# Patient Record
Sex: Male | Born: 1990 | Marital: Single | Smoking: Never smoker
Health system: Southern US, Community
[De-identification: ages and names within clinical notes are randomized; demographics above are authoritative.]

## PROBLEM LIST (undated history)

## (undated) DIAGNOSIS — I10 Essential (primary) hypertension: Secondary | ICD-10-CM

## (undated) DIAGNOSIS — R569 Unspecified convulsions: Secondary | ICD-10-CM

## (undated) DIAGNOSIS — E669 Obesity, unspecified: Secondary | ICD-10-CM

## (undated) HISTORY — DX: Unspecified convulsions: R56.9

## (undated) HISTORY — DX: Obesity, unspecified: E66.9

---

## 2012-12-29 ENCOUNTER — Encounter: Payer: Self-pay | Admitting: Neurology

## 2012-12-29 ENCOUNTER — Ambulatory Visit (INDEPENDENT_AMBULATORY_CARE_PROVIDER_SITE_OTHER): Payer: PRIVATE HEALTH INSURANCE | Admitting: Neurology

## 2012-12-29 VITALS — BP 150/82 | HR 50 | Ht 69.25 in | Wt 355.0 lb

## 2012-12-29 DIAGNOSIS — G40309 Generalized idiopathic epilepsy and epileptic syndromes, not intractable, without status epilepticus: Secondary | ICD-10-CM | POA: Insufficient documentation

## 2012-12-29 MED ORDER — PHENYTOIN SODIUM EXTENDED 100 MG PO CAPS: 100.0000 mg | ORAL_CAPSULE | Freq: Three times a day (TID) | ORAL | Status: DC

## 2012-12-29 NOTE — Patient Instructions (Signed)
Seizure, Adult  A seizure is abnormal electrical activity in the brain. Seizures can cause a change in attention or behavior (altered mental status). Seizures often involve uncontrollable shaking (convulsions). Seizures usually last from 30 seconds to 2 minutes. Epilepsy is a brain disorder in which a patient has repeated seizures over time.  CAUSES   There are many different problems that can cause seizures. In some cases, no cause is found. Common causes of seizures include:   Head injuries.   Brain tumors.   Infections.   Imbalance of chemicals in the blood.   Kidney failure or liver failure.   Heart disease.   Drug abuse.   Stroke.   Withdrawal from certain drugs or alcohol.   Birth defects.   Malfunction of a neurosurgical device placed in the brain.  SYMPTOMS   Symptoms vary depending on the part of the brain that is involved. Right before a seizure, you may have a warning (aura) that a seizure is about to occur. An aura may include the following symptoms:    Fear or anxiety.   Nausea.   Feeling like the room is spinning (vertigo).   Vision changes, such as seeing flashing lights or spots.  Common symptoms during a seizure include:   Convulsions.   Drooling.   Rapid eye movements.   Grunting.   Loss of bladder and bowel control.   Bitter taste in the mouth.  After a seizure, you may feel confused and sleepy. You may also have an injury resulting from convulsions during the seizure.  DIAGNOSIS   Your caregiver will perform a physical exam and run some tests to determine the type and cause of your seizure. These tests may include:   Blood tests.   A lumbar puncture test. In this test, a small amount of fluid is removed from the spine and examined.   Electrocardiography (ECG). This test records the electrical activity in your heart.   Imaging tests, such as computed tomography (CT) scans or magnetic resonance imaging (MRI).   Electroencephalography (EEG). This test records the electrical  activity in your brain.  TREATMENT   Seizures usually stop on their own. Treatment will depend on the cause of your seizure. In some cases, medicine may be given to prevent future seizures.  HOME CARE INSTRUCTIONS    If you are given medicines, take them exactly as prescribed by your caregiver.   Keep all follow-up appointments as directed by your caregiver.   Do not swim or drive until your caregiver says it is okay.   Teach friends and family what to do if you have a seizure. They should:   Lay you on the ground to prevent a fall.   Put a cushion under your head.   Loosen any tight clothing around your neck.   Turn you on your side. If vomiting occurs, this helps keep your airway clear.   Stay with you until you recover.  SEEK IMMEDIATE MEDICAL CARE IF:   The seizure lasts longer than 2 to 5 minutes.   The seizure is severe or the person does not wake up after the seizure.   The person has altered mental status.  Drive the person to the emergency department or call your local emergency services (911 in [place]  MAKE SURE YOU:   Understand these instructions.   Will watch your condition.   Will get help right away if you are not doing well or get worse.  Document Released: 09/06/2000 Document Revised: 12/02/2011 Document   Reviewed: 08/28/2011  [employer] Patient Information 2013 [employer].

## 2012-12-29 NOTE — Progress Notes (Signed)
Reason for visit: Seizures  Matthew Andrade is a 22 y.o. male  History of present illness:  Matthew Andrade is a 22 year old right-handed black male with a history of 2 seizure events prior to this office visit. The first seizure occurred on 09/23/2012. The patient was noted to have some sweats, and decreased responsiveness, and then he had a generalized seizure event with jerking and stiffening. The patient bit his tongue. The patient had a second event while at work on 12/23/2012. The patient once again felt hot and sweaty, he had a feeling of anxiety, and a sensation that his left arm was numb. The patient then blacked out, and he bit his tongue, and had some vomiting afterwards. The patient woke up in the hospital. A CT scan of the brain was done at [facility], and this reportedly was normal. The results are not available to me. The patient has not been placed on anticonvulsant medications, but he has not been operating a motor vehicle. The patient indicates no family history of seizures. The patient denies any prior history of head trauma or concussion. The patient is sent to this office for an evaluation. The patient indicates that he smokes marijuana on a daily basis.  Past Medical History  Diagnosis Date  . Seizures   . Obesity     History reviewed. No pertinent past surgical history.  Family History  Problem Relation Age of Onset  . Hypertension Mother   . Hypertension Father   . Diabetes Father   . Heart Problems Maternal Grandmother   . Diabetes Maternal Grandfather   . Heart Problems Maternal Grandfather     Social history:  reports that he has never smoked. He does not have any smokeless tobacco history on file. He reports that  drinks alcohol. He reports that he uses illicit drugs (Marijuana).  Medications:  No current outpatient prescriptions on file prior to visit.   No current facility-administered medications on file prior to visit.    Allergies:  Allergies   Allergen Reactions  . Sulfa Antibiotics     Hives    ROS:  Out of a complete 14 system review of symptoms, the patient complains only of the following symptoms, and all other reviewed systems are negative.  Palpitations Feeling hot, increased thirst Headache Seizures Anxiety Racing thoughts  Blood pressure 150/82, pulse 50, height 5' 9.25" (1.759 m), weight 355 lb (161.027 kg).  Physical Exam  General: The patient is alert and cooperative at the time of the examination. The patient is markedly obese.  Head: Pupils are equal, round, and reactive to light. Discs are flat bilaterally.  Neck: The neck is supple, no carotid bruits are noted.  Respiratory: The respiratory examination is clear.  Cardiovascular: The cardiovascular examination reveals a regular rate and rhythm, no obvious murmurs or rubs are noted.  Skin: Extremities are without significant edema.  Neurologic Exam  Mental status:  Cranial nerves: Facial symmetry is present. There is good sensation of the face to pinprick and soft touch bilaterally. The strength of the facial muscles and the muscles to head turning and shoulder shrug are normal bilaterally. Speech is well enunciated, no aphasia or dysarthria is noted. Extraocular movements are full. Visual fields are full.  Motor: The motor testing reveals 5 over 5 strength of all 4 extremities. Good symmetric motor tone is noted throughout.  Sensory: Sensory testing is intact to pinprick, soft touch, vibration sensation, and position sense on all 4 extremities. No evidence of extinction is noted.  Coordination: Cerebellar testing reveals good finger-nose-finger and heel-to-shin bilaterally.  Gait and station: Gait is normal. Tandem gait is normal. Romberg is negative. No drift is seen  Reflexes: Deep tendon reflexes are symmetric and normal bilaterally. Toes are downgoing bilaterally.   Assessment/Plan:  1. Generalized seizure disorder, new onset  The  patient will be placed on Dilantin therapy at this time. The patient will be set up for MRI evaluation of the brain with and without gadolinium, and he will have an EEG study. The patient will followup in about 4 months. Three weeks from now, the patient will come in to get blood work drawn to check the Dilantin level. The patient will contact me if he has another seizure event. The patient is not to drive for least 6 months following the last seizure. I have discussed the possibility of reducing his marijuana use, as this may lower the seizure threshold.  Marlan Palau MD 12/29/2012 7:56 PM  [facility] [address] [place], [place] [address]  Phone [phone] Fax [phone]

## 2012-12-30 ENCOUNTER — Telehealth: Payer: Self-pay | Admitting: Neurology

## 2012-12-30 ENCOUNTER — Ambulatory Visit (INDEPENDENT_AMBULATORY_CARE_PROVIDER_SITE_OTHER): Payer: PRIVATE HEALTH INSURANCE

## 2012-12-30 DIAGNOSIS — G40309 Generalized idiopathic epilepsy and epileptic syndromes, not intractable, without status epilepticus: Secondary | ICD-10-CM

## 2012-12-30 NOTE — Telephone Encounter (Signed)
I called the patient. I talked with the mother. The EEG study was unremarkable. I'll contact him once I have the results of the MRI study that was ordered. The patient will go on Dilantin.

## 2012-12-30 NOTE — Procedures (Signed)
  History:   Matthew Andrade is a 22 year old gentleman with a history of at least 2 events witnessed with generalized jerking and stiffening and tongue biting. The patient is being evaluated for probable seizure events.  This is a routine EEG. No skull defects are noted. The patient was on no medications prior to the EEG study. The patient will be going on Dilantin.   EEG classification: Normal awake and drowsy  Description of the recording: The background rhythms of this recording consists of a fairly well modulated medium amplitude alpha rhythm of 11 Hz that is reactive to eye opening and closure. As the record progresses, the patient appears to remain in the waking state throughout the recording. Photic stimulation was performed, resulting in a bilateral and symmetric photic driving response. Hyperventilation was also performed, resulting in a minimal buildup of the background rhythm activities without significant slowing seen. Toward the end of the recording, the patient enters the drowsy state with slight symmetric slowing seen. The patient never enters stage II sleep. At no time during the recording does there appear to be evidence of spike or spike wave discharges or evidence of focal slowing. EKG monitor shows no evidence of cardiac rhythm abnormalities with a heart rate of 56.  Impression: This is a normal EEG recording in the waking and drowsy state. No evidence of ictal or interictal discharges are seen.

## 2012-12-31 ENCOUNTER — Telehealth: Payer: Self-pay | Admitting: Neurology

## 2013-01-06 ENCOUNTER — Ambulatory Visit
Admission: RE | Admit: 2013-01-06 | Discharge: 2013-01-06 | Disposition: A | Discharge status: Home / Self Care | Payer: PRIVATE HEALTH INSURANCE | Source: Ambulatory Visit | Attending: Neurology | Admitting: Neurology

## 2013-01-06 DIAGNOSIS — G40309 Generalized idiopathic epilepsy and epileptic syndromes, not intractable, without status epilepticus: Secondary | ICD-10-CM

## 2013-01-06 MED ORDER — GADOBENATE DIMEGLUMINE 529 MG/ML IV SOLN
20.0000 mL | Freq: Once | INTRAVENOUS | Status: AC | PRN
  Administered 2013-01-06: 20 mL via INTRAVENOUS

## 2013-01-07 ENCOUNTER — Telehealth: Payer: Self-pay | Admitting: Neurology

## 2013-01-07 NOTE — Telephone Encounter (Signed)
I called the patient. The MRI of the brain is essentially normal. He is to stay on the dilantin.

## 2013-01-11 ENCOUNTER — Telehealth: Payer: Self-pay | Admitting: *Deleted

## 2013-01-11 NOTE — Telephone Encounter (Signed)
Please read below...

## 2013-01-11 NOTE — Telephone Encounter (Signed)
I agree with a medical assessment and medical device given.

## 2013-01-11 NOTE — Telephone Encounter (Signed)
I called and LMVM for mother < Matthew Andrade about the MRI results.   Normal (small spots scar tissue noted, no acute findings).  No other testing needed. To call back as needed.

## 2013-01-11 NOTE — Telephone Encounter (Signed)
Patient mother is calling wanting to know if her son can drive to and from work because his scans where good. She also wanted to know if her son seizures where drug or alcohol induced. Her son left out that he started smoking e-cigs and both times he had siezures he had smoked a e-cig  But since then he has not smoked one. I let her know per Dr. Anne Hahn notes he is not released to drive until 6 months from the time he had his last  Seizure. And She should follow his recommendations until he says other wise she showed understanding. Return # [phone]

## 2013-01-13 ENCOUNTER — Telehealth: Payer: Self-pay | Admitting: *Deleted

## 2013-01-13 DIAGNOSIS — Z5181 Encounter for therapeutic drug level monitoring: Secondary | ICD-10-CM

## 2013-01-13 NOTE — Telephone Encounter (Signed)
Patient mother called stating she would like to know if there's any other testing to find out why Serjio is having seizures.

## 2013-01-13 NOTE — Telephone Encounter (Signed)
I called the mother. I discussed the MRI results of the brain, and an EEG study. The patient is on Dilantin, and he is feeling somewhat tired on the medication. We will check blood work at this time. The patient will come in for this. The patient is not to drive for 6 months.

## 2013-01-14 ENCOUNTER — Other Ambulatory Visit: Payer: Self-pay | Admitting: *Deleted

## 2013-01-14 ENCOUNTER — Other Ambulatory Visit: Payer: Self-pay | Admitting: Neurology

## 2013-01-14 DIAGNOSIS — Z5181 Encounter for therapeutic drug level monitoring: Secondary | ICD-10-CM

## 2013-01-15 ENCOUNTER — Telehealth: Payer: Self-pay | Admitting: Neurology

## 2013-01-15 LAB — COMPREHENSIVE METABOLIC PANEL
Albumin: 4.1 g/dL (ref 3.5–5.5)
BUN: 9 mg/dL (ref 6–20)
CO2: 27 mmol/L (ref 19–28)
Calcium: 9.3 mg/dL (ref 8.7–10.2)
Chloride: 100 mmol/L (ref 96–108)
GFR calc Af Amer: 145 mL/min/{1.73_m2} (ref >59)
Glucose: 67 mg/dL (ref 65–99)
Total Bilirubin: 0.1 mg/dL (ref 0.0–1.2)
Total Protein: 6.8 g/dL (ref 6.0–8.5)

## 2013-01-15 LAB — CBC WITH DIFFERENTIAL
Basophils Absolute: 0 10*3/uL (ref 0.0–0.2)
Eos: 9 % — ABNORMAL HIGH (ref 0–5)
MCH: 30.5 pg (ref 26.6–33.0)
Monocytes Absolute: 0.8 10*3/uL (ref 0.1–0.9)
Neutrophils Relative %: 55 % (ref 40–74)
Platelets: 182 10*3/uL (ref 155–379)
RBC: 4.82 x10E6/uL (ref 4.14–5.80)
WBC: 9.1 10*3/uL (ref 3.4–10.8)

## 2013-01-15 LAB — PHENYTOIN LEVEL, TOTAL: Phenytoin Lvl: 2.4 ug/mL — ABNORMAL LOW (ref 10.0–20.0)

## 2013-01-15 NOTE — Telephone Encounter (Signed)
I called patient. The blood work is relatively unremarkable, eosinophil count is somewhat elevated, but the Dilantin level is low. The patient is having some problems with tolerance of the Dilantin. We will keep at 300 mg daily for now, go to 400 mg daily in the future.

## 2013-02-19 NOTE — Telephone Encounter (Signed)
i

## 2013-04-29 ENCOUNTER — Encounter: Payer: Self-pay | Admitting: Nurse Practitioner

## 2013-04-29 ENCOUNTER — Ambulatory Visit (INDEPENDENT_AMBULATORY_CARE_PROVIDER_SITE_OTHER): Payer: PRIVATE HEALTH INSURANCE | Admitting: Nurse Practitioner

## 2013-04-29 VITALS — BP 147/89 | HR 50 | Ht 70.5 in | Wt 353.0 lb

## 2013-04-29 DIAGNOSIS — G40309 Generalized idiopathic epilepsy and epileptic syndromes, not intractable, without status epilepticus: Secondary | ICD-10-CM

## 2013-04-29 DIAGNOSIS — Z79899 Other long term (current) drug therapy: Secondary | ICD-10-CM

## 2013-04-29 LAB — PHENYTOIN LEVEL, TOTAL: Phenytoin Lvl: 2 ug/mL — ABNORMAL LOW (ref 10.0–20.0)

## 2013-04-29 NOTE — Progress Notes (Signed)
I have read the note, and I agree with the clinical assessment and plan.  

## 2013-04-29 NOTE — Patient Instructions (Addendum)
Continue Dilantin at current dose Will check Dilantin level today Given patient educational material on epilepsy No driving for 6 months from last seizure (12/23/12) Followup in 6 months

## 2013-04-29 NOTE — Progress Notes (Signed)
  Reason for visit  followup for seizure disorder Matthew Andrade 22 y.o.male  HPI: Matthew Andrade, 22 year old black male returns for followup with history of 2 seizure events. He was initially evaluated in this office by Dr. Anne Hahn 12/29/2012.  First seizure occurred in 09/23/2012 he was noted to have sweats and decreased responsiveness and then a generalized jerking and stiffening of his body. He bit his tongue. The second event occurred while at work 12-2012. The patient once again with hot and sweaty. He had a feeling of anxiety and a sensation that his left arm was numb he  then blacked out,  bit his tongue and had some vomiting after words, he woke up in the hospital. CT scan of the brain was reportedly normal at [facility] these results are not available.  He was not placed on anticonvulsive medication. There is no family history of seizures. The patient denies any prior history of head trauma. The patient does indicate that he smokes marijuana on a daily basis. MRI of the brain 01/06/2013 was normal without acute findings. EEG was normal. The patient was placed on Dilantin 300 mg by Dr. Anne Hahn. Dilantin level drawn for 01/14/13  was 2.4. Will repeat level today. He denies any side effects to the medication.    Medications Current Outpatient Prescriptions on File Prior to Visit  Medication Sig Dispense Refill  . phenytoin (DILANTIN) 100 MG ER capsule Take 1 capsule (100 mg total) by mouth 3 (three) times daily.  90 capsule  5   No current facility-administered medications on file prior to visit.   ROS:  negative  Physical Exam General: well developed, morbidly obese  seated, in no evident distress Head: head normocephalic and atraumatic. Oropharynx benign  Neurologic Exam Mental Status: Awake and fully alert. Oriented to place and time. Follows all commands. Speech and language normal.   Cranial Nerves: Fundoscopic exam reveals flat  discs. Pupils equal, briskly reactive to light.  Extraocular movements full without nystagmus. Visual fields full to confrontation. Hearing intact and symmetric to finger snap. Facial sensation intact. Face, tongue, palate move normally and symmetrically. Neck flexion and extension normal.  Motor: Normal bulk and tone. Normal strength in all tested extremity muscles.No focal weakness Sensory.: intact to touch and pinprick and vibratory.  Coordination: Rapid alternating movements normal in all extremities. Finger-to-nose and heel-to-shin performed accurately bilaterally. Gait and Station: Arises from chair without difficulty. Stance is normal. Gait demonstrates normal stride length and balance . Able to heel, toe and tandem walk without difficulty.  Reflexes: 2+ and symmetric. Toes downgoing.     ASSESSMENT: New onset generalized seizure disorder, currently on Dilantin 300 daily without further seizure activity EEG was normal MRI of the brain was normal     PLAN: Will check Dilantin level today Continue 300 mg daily for now No driving for 6 months from most recent seizure which occurred 12-23-2012 Given patient information materials epilepsy and answered additional questions Followup in 6 months  Nilda Riggs, GNP-BC APRN

## 2013-04-30 ENCOUNTER — Other Ambulatory Visit: Payer: Self-pay | Admitting: Nurse Practitioner

## 2013-04-30 MED ORDER — PHENYTOIN SODIUM EXTENDED 100 MG PO CAPS: ORAL_CAPSULE | ORAL | Status: DC

## 2013-11-01 ENCOUNTER — Encounter: Payer: Self-pay | Admitting: Nurse Practitioner

## 2013-11-01 ENCOUNTER — Encounter (INDEPENDENT_AMBULATORY_CARE_PROVIDER_SITE_OTHER): Payer: Self-pay

## 2013-11-01 ENCOUNTER — Ambulatory Visit (INDEPENDENT_AMBULATORY_CARE_PROVIDER_SITE_OTHER): Payer: PRIVATE HEALTH INSURANCE | Admitting: Nurse Practitioner

## 2013-11-01 VITALS — BP 154/70 | HR 70 | Ht 70.5 in | Wt 349.0 lb

## 2013-11-01 DIAGNOSIS — G40309 Generalized idiopathic epilepsy and epileptic syndromes, not intractable, without status epilepticus: Secondary | ICD-10-CM

## 2013-11-01 DIAGNOSIS — Z79899 Other long term (current) drug therapy: Secondary | ICD-10-CM

## 2013-11-01 NOTE — Progress Notes (Signed)
[facility]  PATIENT: Matthew Andrade DOB: Feb 18, 1991   REASON FOR VISIT: Followup for seizure disorder   HISTORY OF PRESENT ILLNESS: Mr. Ordway, 23 year old male returns for followup. He has a history of 2 seizure events and is currently on Dilantin 400 mg daily. His last level was checked in August 2014. He has not had seizure activity since April 2014. He is accompanied today by his mother. He needs repeat labs. He returns for reevaluation.   HISTORY: of 2 seizure events. He was initially evaluated in this office by Dr. Anne Hahn 12/29/2012. First seizure occurred in 09/23/2012 he was noted to have sweats and decreased responsiveness and then a generalized jerking and stiffening of his body. He bit his tongue. The second event occurred while at work 12-2012. The patient once again with hot and sweaty. He had a feeling of anxiety and a sensation that his left arm was numb he then blacked out, bit his tongue and had some vomiting after words, he woke up in the hospital. CT scan of the brain was reportedly normal at [facility] these results are not available. He was not placed on anticonvulsive medication. There is no family history of seizures. The patient denies any prior history of head trauma. The patient does indicate that he smokes marijuana on a daily basis. MRI of the brain 01/06/2013 was normal without acute findings. EEG was normal. The patient was placed on Dilantin 300 mg by Dr. Anne Hahn. Dilantin level drawn for 01/14/13 was 2.4. Will repeat level today. He denies any side effects to the medication.      REVIEW OF SYSTEMS: Full 14 system review of systems performed and notable only for those listed, all others are neg:  Constitutional: N/A  Cardiovascular: N/A  Ear/Nose/Throat: N/A  Skin: N/A  Eyes: N/A  Respiratory: N/A  Gastroitestinal: N/A  Hematology/Lymphatic: N/A  Endocrine: N/A Musculoskeletal:N/A  Allergy/Immunology: N/A  Neurological:  tremor Psychiatric: N/A   ALLERGIES: Allergies  Allergen Reactions  . Sulfa Antibiotics     Hives    HOME MEDICATIONS: Outpatient Prescriptions Prior to Visit  Medication Sig Dispense Refill  . phenytoin (DILANTIN) 100 MG ER capsule 2 caps in the am, 2 caps in the pm  120 capsule  6   No facility-administered medications prior to visit.    PAST MEDICAL HISTORY: Past Medical History  Diagnosis Date  . Seizures   . Obesity     PAST SURGICAL HISTORY: History reviewed. No pertinent past surgical history.  FAMILY HISTORY: Family History  Problem Relation Age of Onset  . Hypertension Mother   . Hypertension Father   . Diabetes Father   . Heart Problems Maternal Grandmother   . Diabetes Maternal Grandfather   . Heart Problems Maternal Grandfather     SOCIAL HISTORY: History   Social History  . Marital Status: Single    Spouse Name: N/A    Number of Children: 0  . Years of Education: college-1   Occupational History  .    [employer]   Social History Main Topics  . Smoking status: Never Smoker   . Smokeless tobacco: Never Used  . Alcohol Use: Yes     Comment: Once monthly  . Drug Use: Yes    Special: Marijuana     Comment: daily  . Sexual Activity: Not on file   Other Topics Concern  . Not on file   Social History Narrative   Patient lives at home with parents.  Patient is single.   Patient is right handed.    Patient has a high school education.    Patient is currently working part time at [employer].      PHYSICAL EXAM  Filed Vitals:   11/01/13 1021  BP: 154/70  Pulse: 70  Height: 5' 10.5" (1.791 m)  Weight: 349 lb (158.305 kg)   Body mass index is 49.35 kg/(m^2).  Generalized: Well developed, morbidly obese male in no acute distress   Neurological examination   Mentation: Alert oriented to time, place, history taking. Follows all commands speech and language fluent  Cranial nerve II-XII: Pupils were equal round reactive to  light extraocular movements were full, visual field were full on confrontational test. Facial sensation and strength were normal. hearing was intact to finger rubbing bilaterally. Uvula tongue midline. head turning and shoulder shrug were normal and symmetric.Tongue protrusion into cheek strength was normal. Motor: normal bulk and tone, full strength in the BUE, BLE,  No focal weakness, no tremor Coordination: finger-nose-finger, heel-to-shin bilaterally, no dysmetria Reflexes: Brachioradialis 2/2, biceps 2/2, triceps 2/2, patellar 2/2, Achilles 2/2, plantar responses were flexor bilaterally. Gait and Station: Rising up from seated position without assistance, normal stance,  moderate stride, good arm swing, smooth turning, able to perform tiptoe, and heel walking without difficulty. Tandem gait is steady  DIAGNOSTIC DATA (LABS, IMAGING, TESTING) - I reviewed patient records, labs, notes, testing and imaging myself where available.  Lab Results  Component Value Date   WBC 9.1 01/14/2013   HGB 14.7 01/14/2013   HCT 42.5 01/14/2013   MCV 88 01/14/2013   PLT 182 01/14/2013      Component Value Date/Time   NA 140 01/14/2013 1049   K 4.1 01/14/2013 1049   CL 100 01/14/2013 1049   CO2 27 01/14/2013 1049   GLUCOSE 67 01/14/2013 1049   BUN 9 01/14/2013 1049   CREATININE 0.82 01/14/2013 1049   CALCIUM 9.3 01/14/2013 1049   PROT 6.8 01/14/2013 1049   AST 27 01/14/2013 1049   ALT 38 01/14/2013 1049   ALKPHOS 56 01/14/2013 1049   BILITOT 0.1 01/14/2013 1049   GFRNONAA 126 01/14/2013 1049   GFRAA 145 01/14/2013 1049       ASSESSMENT AND PLAN  [age] y.o. year old male  has a past medical history of Seizures and Obesity. here  to followup for seizure disorder. Last  seizure activity April 2014.  Continue Dilantin at current dose Will check trough level later this week If  labs are stable can followup yearly and when necessary Nilda RiggsNancy Carolyn Martin, [facility] HospitalGNP, [facility] LLCBC, APRN  [facility] [address] [place], [place] [address] [phone]

## 2013-11-01 NOTE — Progress Notes (Signed)
I have read the note, and I agree with the clinical assessment and plan.  WILLIS,CHARLES KEITH   

## 2013-11-01 NOTE — Patient Instructions (Signed)
Continue Dilantin at current dose Will check trough level later this week If  labs are stable can followup yearly and when necessary

## 2013-11-04 ENCOUNTER — Other Ambulatory Visit (INDEPENDENT_AMBULATORY_CARE_PROVIDER_SITE_OTHER): Payer: Self-pay

## 2013-11-04 DIAGNOSIS — Z0289 Encounter for other administrative examinations: Secondary | ICD-10-CM

## 2013-11-04 DIAGNOSIS — G40309 Generalized idiopathic epilepsy and epileptic syndromes, not intractable, without status epilepticus: Secondary | ICD-10-CM

## 2013-11-04 DIAGNOSIS — Z79899 Other long term (current) drug therapy: Secondary | ICD-10-CM

## 2013-11-04 LAB — CBC WITH DIFFERENTIAL/PLATELET
BASOS ABS: 0 10*3/uL (ref 0.0–0.2)
Basos: 0 %
EOS ABS: 0.5 10*3/uL — AB (ref 0.0–0.4)
Eos: 5 %
HEMATOCRIT: 43.7 % (ref 37.5–51.0)
Hemoglobin: 15.3 g/dL (ref 12.6–17.7)
LYMPHS ABS: 2.4 10*3/uL (ref 0.7–3.1)
Lymphs: 26 %
MCH: 31.4 pg (ref 26.6–33.0)
MCHC: 35 g/dL (ref 31.5–35.7)
MCV: 90 fL (ref 79–97)
MONOS ABS: 1 10*3/uL — AB (ref 0.1–0.9)
Monocytes: 10 %
Neutrophils Absolute: 5.4 10*3/uL (ref 1.4–7.0)
Neutrophils Relative %: 59 %
RBC: 4.88 x10E6/uL (ref 4.14–5.80)
RDW: 13.6 % (ref 12.3–15.4)
WBC: 9.3 10*3/uL (ref 3.4–10.8)

## 2013-11-04 LAB — COMPREHENSIVE METABOLIC PANEL
ALT: 44 IU/L (ref 0–44)
AST: 23 IU/L (ref 0–40)
Albumin/Globulin Ratio: 1.8 (ref 1.1–2.5)
Albumin: 4.2 g/dL (ref 3.5–5.5)
Alkaline Phosphatase: 66 IU/L (ref 39–117)
BUN/Creatinine Ratio: 10 (ref 8–19)
BUN: 8 mg/dL (ref 6–20)
CALCIUM: 9.4 mg/dL (ref 8.7–10.2)
CHLORIDE: 101 mmol/L (ref 96–108)
CO2: 29 mmol/L (ref 18–29)
Creatinine, Ser: 0.84 mg/dL (ref 0.76–1.27)
GFR calc non Af Amer: 123 mL/min/{1.73_m2} (ref >59)
GFR, EST AFRICAN AMERICAN: 143 mL/min/{1.73_m2} (ref >59)
GLUCOSE: 66 mg/dL (ref 65–99)
Globulin, Total: 2.4 g/dL (ref 1.5–4.5)
POTASSIUM: 4.2 mmol/L (ref 3.5–5.2)
Sodium: 141 mmol/L (ref 134–144)
TOTAL PROTEIN: 6.6 g/dL (ref 6.0–8.5)

## 2013-11-04 LAB — PHENYTOIN LEVEL, TOTAL: Phenytoin Lvl: 3.7 ug/mL — ABNORMAL LOW (ref 10.0–20.0)

## 2013-11-05 ENCOUNTER — Telehealth: Payer: Self-pay | Admitting: Nurse Practitioner

## 2013-11-05 MED ORDER — PHENYTOIN SODIUM EXTENDED 100 MG PO CAPS: ORAL_CAPSULE | ORAL | Status: DC

## 2013-11-05 NOTE — Telephone Encounter (Signed)
TC to patient, Dilantin Level remains low but better than last specimen obtained.Increase to 500mg  total dose daily. Will call in new RX Patient verbalizes understanding.No seizure activity since April of last year. Will call for seizure activity.

## 2014-08-16 ENCOUNTER — Telehealth: Payer: Self-pay | Admitting: Neurology

## 2014-08-16 NOTE — Telephone Encounter (Signed)
Left message for patient regarding rescheduling 11/01/14 appointment time per Carolyn's schedule, moved it to earlier appointment in the day.

## 2014-08-23 ENCOUNTER — Other Ambulatory Visit: Payer: Self-pay

## 2014-08-23 MED ORDER — PHENYTOIN SODIUM EXTENDED 100 MG PO CAPS: ORAL_CAPSULE | ORAL | Status: DC

## 2014-11-01 ENCOUNTER — Ambulatory Visit: Payer: PRIVATE HEALTH INSURANCE | Admitting: Nurse Practitioner

## 2014-11-01 ENCOUNTER — Ambulatory Visit (INDEPENDENT_AMBULATORY_CARE_PROVIDER_SITE_OTHER): Payer: PRIVATE HEALTH INSURANCE | Admitting: Nurse Practitioner

## 2014-11-01 ENCOUNTER — Encounter: Payer: Self-pay | Admitting: Nurse Practitioner

## 2014-11-01 VITALS — BP 132/75 | HR 62 | Temp 97.9°F | Ht 70.5 in | Wt 397.0 lb

## 2014-11-01 DIAGNOSIS — G40309 Generalized idiopathic epilepsy and epileptic syndromes, not intractable, without status epilepticus: Secondary | ICD-10-CM

## 2014-11-01 DIAGNOSIS — Z5181 Encounter for therapeutic drug level monitoring: Secondary | ICD-10-CM

## 2014-11-01 NOTE — Progress Notes (Signed)
[facility]  PATIENT: Matthew Andrade DOB: 03/05/1991   REASON FOR VISIT: Follow-up for seizure disorder HISTORY FROM: Patient    HISTORY OF PRESENT ILLNESS:Mr. Lusty, 24 year old male returns for followup. He has a history of 2 seizure events and is currently on Dilantin 500 mg daily. He denies any side effects to the drug, he has not had any balance issues, no falls. His last level was checked in February 2015. He had a dose adjustment at that time.  He has not had seizure activity since April 2014.  He needs repeat labs. He has gained about 40 pounds since last seen. He denies any daytime drowsiness or snoring at night He returns for reevaluation.   HISTORY: of 2 seizure events. He was initially evaluated in this office by Dr. Anne Hahn 12/29/2012. First seizure occurred in 09/23/2012 he was noted to have sweats and decreased responsiveness and then a generalized jerking and stiffening of his body. He bit his tongue. The second event occurred while at work 12-2012. The patient once again with hot and sweaty. He had a feeling of anxiety and a sensation that his left arm was numb he then blacked out, bit his tongue and had some vomiting after words, he woke up in the hospital. CT scan of the brain was reportedly normal at [facility] these results are not available. He was not placed on anticonvulsive medication. There is no family history of seizures. The patient denies any prior history of head trauma. The patient does indicate that he smokes marijuana on a daily basis. MRI of the brain 01/06/2013 was normal without acute findings. EEG was normal. The patient was placed on Dilantin 300 mg by Dr. Anne Hahn. Dilantin level drawn for 01/14/13 was 2.4. Will repeat level today. He denies any side effects to the medication.   REVIEW OF SYSTEMS: Full 14 system review of systems performed and notable only for those listed, all others are neg:  Constitutional: neg  Cardiovascular:  neg Ear/Nose/Throat: neg  Skin: neg Eyes: neg Respiratory: neg Gastroitestinal: neg  Hematology/Lymphatic: neg  Endocrine: neg Musculoskeletal:neg Allergy/Immunology: neg Neurological: neg Psychiatric: neg Sleep : neg   ALLERGIES: Allergies  Allergen Reactions  . Sulfa Antibiotics     Hives    HOME MEDICATIONS: Outpatient Prescriptions Prior to Visit  Medication Sig Dispense Refill  . phenytoin (DILANTIN) 100 MG ER capsule 2 caps in the am, 3 caps in the pm 150 capsule 2   No facility-administered medications prior to visit.    PAST MEDICAL HISTORY: Past Medical History  Diagnosis Date  . Seizures   . Obesity     PAST SURGICAL HISTORY: History reviewed. No pertinent past surgical history.  FAMILY HISTORY: Family History  Problem Relation Age of Onset  . Hypertension Mother   . Hypertension Father   . Diabetes Father   . Heart Problems Maternal Grandmother   . Diabetes Maternal Grandfather   . Heart Problems Maternal Grandfather     SOCIAL HISTORY: History   Social History  . Marital Status: Single    Spouse Name: N/A    Number of Children: 0  . Years of Education: college-1   Occupational History  .    [employer]  [employer]   Social History Main Topics  . Smoking status: Never Smoker   . Smokeless tobacco: Never Used  . Alcohol Use: Yes     Comment: Once monthly  . Drug Use: Yes    Special: Marijuana     Comment:  daily  . Sexual Activity: Not on file   Other Topics Concern  . Not on file   Social History Narrative   Patient lives at home with parents.    Patient is single.   Patient is right handed.    Patient has a high school education.    Patient is currently working part time at [employer].      PHYSICAL EXAM  Filed Vitals:   11/01/14 0829  BP: 132/75  Pulse: 62  Temp: 97.9 F (36.6 C)  TempSrc: Oral  Height: 5' 10.5" (1.791 m)  Weight: 397 lb (180.078 kg)   Body mass index is 56.14 kg/(m^2).  Generalized: Well  developed, morbidly obese male in no acute distress  Head: normocephalic and atraumatic,. Oropharynx benign  Neck: Supple, no carotid bruits  Cardiac: Regular rate rhythm, no murmur  Musculoskeletal: No deformity   Neurological examination   Mentation: Alert oriented to time, place, history taking. Attention span and concentration appropriate. Recent and remote memory intact.  Follows all commands speech and language fluent.   Cranial nerve II-XII: Fundoscopic exam reveals sharp disc margins.Pupils were equal round reactive to light extraocular movements were full, visual field were full on confrontational test. Facial sensation and strength were normal. hearing was intact to finger rubbing bilaterally. Uvula tongue midline. head turning and shoulder shrug were normal and symmetric.Tongue protrusion into cheek strength was normal. Motor: normal bulk and tone, full strength in the BUE, BLE, fine finger movements normal, no pronator drift. No focal weakness Sensory: normal and symmetric to light touch, pinprick, and  Vibration, proprioception  Coordination: finger-nose-finger, heel-to-shin bilaterally, no dysmetria Reflexes: Brachioradialis 2/2, biceps 2/2, triceps 2/2, patellar 2/2, Achilles 2/2, plantar responses were flexor bilaterally. Gait and Station: Rising up from seated position without assistance, normal stance,  moderate stride, good arm swing, smooth turning, able to perform tiptoe, and heel walking without difficulty. Tandem gait is steady  DIAGNOSTIC DATA (LABS, IMAGING, TESTING) - I reviewed patient records, labs, notes, testing and imaging myself where available.  Lab Results  Component Value Date   WBC 9.3 11/04/2013   HGB 15.3 11/04/2013   HCT 43.7 11/04/2013   MCV 90 11/04/2013   PLT 182 01/14/2013      Component Value Date/Time   NA 141 11/04/2013 0911   K 4.2 11/04/2013 0911   CL 101 11/04/2013 0911   CO2 29 11/04/2013 0911   GLUCOSE 66 11/04/2013 0911   BUN 8  11/04/2013 0911   CREATININE 0.84 11/04/2013 0911   CALCIUM 9.4 11/04/2013 0911   PROT 6.6 11/04/2013 0911   AST 23 11/04/2013 0911   ALT 44 11/04/2013 0911   ALKPHOS 66 11/04/2013 0911   BILITOT <0.2 11/04/2013 0911   GFRNONAA 123 11/04/2013 0911   GFRAA 143 11/04/2013 0911   ASSESSMENT AND PLAN  24 y.o. year old male  has a past medical history of Seizures and Obesity. here to follow-up. Last seizure activity occurred in April 2014. He is currently on Dilantin 500 mg total dose daily. He has already taken his medication today.  Continue Dilantin at current dose, will refill once labs back Will check trough level later this week already taken morning dose If labs are stable follow up yearly Nilda RiggsNancy Carolyn Tyger Wichman, [facility] Hospital[facility]NP, [facility], APRN  [facility] [address], [address] [place], [place] [address] [phone]

## 2014-11-01 NOTE — Patient Instructions (Signed)
Continue Dilantin at current dose, will refill once labs back Will check trough level later this week already taken morning dose If labs are stable follow up yearly

## 2014-11-01 NOTE — Progress Notes (Signed)
I have read the note, and I agree with the clinical assessment and plan.  Matthew Andrade,Matthew Andrade   

## 2014-11-09 ENCOUNTER — Telehealth: Payer: Self-pay | Admitting: Nurse Practitioner

## 2014-11-09 NOTE — Telephone Encounter (Signed)
Please let patient know I am still waiting for him to get labs to monitor side effects of his medications

## 2014-11-11 NOTE — Telephone Encounter (Signed)
Spoke to mother. She verbalized understanding. She says she will have patient to come in next week.

## 2015-04-20 ENCOUNTER — Other Ambulatory Visit: Payer: Self-pay

## 2015-04-20 MED ORDER — PHENYTOIN SODIUM EXTENDED 100 MG PO CAPS: ORAL_CAPSULE | ORAL | Status: DC

## 2015-11-02 ENCOUNTER — Ambulatory Visit (INDEPENDENT_AMBULATORY_CARE_PROVIDER_SITE_OTHER): Payer: PRIVATE HEALTH INSURANCE | Admitting: Nurse Practitioner

## 2015-11-02 ENCOUNTER — Encounter: Payer: Self-pay | Admitting: Nurse Practitioner

## 2015-11-02 VITALS — BP 148/94 | HR 68 | Ht 70.5 in | Wt >= 6400 oz

## 2015-11-02 DIAGNOSIS — Z5181 Encounter for therapeutic drug level monitoring: Secondary | ICD-10-CM | POA: Diagnosis not present

## 2015-11-02 DIAGNOSIS — G40309 Generalized idiopathic epilepsy and epileptic syndromes, not intractable, without status epilepticus: Secondary | ICD-10-CM

## 2015-11-02 MED ORDER — PHENYTOIN SODIUM EXTENDED 100 MG PO CAPS: ORAL_CAPSULE | ORAL | Status: AC

## 2015-11-02 NOTE — Progress Notes (Signed)
[facility]  PATIENT: Matthew Andrade DOB: 1991-04-15   REASON FOR VISIT: follow-up generalized epilepsy HISTORY FROM:patient    HISTORY OF PRESENT ILLNESS:Matthew Andrade, 25 year old male returns for followup. He has a history of 2 seizure events and is currently on Dilantin 500 mg daily. He denies any side effects to the drug, he has not had any balance issues, no falls. His last level was checked in February 2015. He had a dose adjustment at that time. He has not had seizure activity since April 2014. He did not get labs after his last visit .He needs repeat labs. He has gained about 40 pounds since last seen. He denies any daytime drowsiness or snoring at night He returns for reevaluation. He only recently started going to the gym.   HISTORY: of 2 seizure events. He was initially evaluated in this office by Dr. Anne Hahn 12/29/2012. First seizure occurred in 09/23/2012 he was noted to have sweats and decreased responsiveness and then a generalized jerking and stiffening of his body. He bit his tongue. The second event occurred while at work 12-2012. The patient once again with hot and sweaty. He had a feeling of anxiety and a sensation that his left arm was numb he then blacked out, bit his tongue and had some vomiting after words, he woke up in the hospital. CT scan of the brain was reportedly normal at [facility] these results are not available. He was not placed on anticonvulsive medication. There is no family history of seizures. The patient denies any prior history of head trauma. The patient does indicate that he smokes marijuana on a daily basis. MRI of the brain 01/06/2013 was normal without acute findings. EEG was normal. The patient was placed on Dilantin 300 mg by Dr. Anne Hahn. Dilantin level drawn for 01/14/13 was 2.4. Will repeat level today. He denies any side effects to the medication.    REVIEW OF SYSTEMS: Full 14 system review of systems performed and notable only  for those listed, all others are neg:  Constitutional: neg  Cardiovascular: neg Ear/Nose/Throat: neg  Skin: neg Eyes: neg Respiratory: neg Gastroitestinal: neg  Hematology/Lymphatic: neg  Endocrine: neg Musculoskeletal:neg Allergy/Immunology: neg Neurological: neg Psychiatric: neg Sleep : neg   ALLERGIES: Allergies  Allergen Reactions  . Sulfa Antibiotics     Hives    HOME MEDICATIONS: Outpatient Prescriptions Prior to Visit  Medication Sig Dispense Refill  . phenytoin (DILANTIN) 100 MG ER capsule 2 caps in the am, 3 caps in the pm 150 capsule 6   No facility-administered medications prior to visit.    PAST MEDICAL HISTORY: Past Medical History  Diagnosis Date  . Seizures (HCC)   . Obesity     PAST SURGICAL HISTORY: History reviewed. No pertinent past surgical history.  FAMILY HISTORY: Family History  Problem Relation Age of Onset  . Hypertension Mother   . Hypertension Father   . Diabetes Father   . Heart Problems Maternal Grandmother   . Diabetes Maternal Grandfather   . Heart Problems Maternal Grandfather     SOCIAL HISTORY: Social History   Social History  . Marital Status: Single    Spouse Name: N/A  . Number of Children: 0  . Years of Education: college-1   Occupational History  .    [employer]  [employer]   Social History Main Topics  . Smoking status: Never Smoker   . Smokeless tobacco: Never Used  . Alcohol Use: Yes     Comment: Once monthly  .  Drug Use: Yes    Special: Marijuana     Comment: daily  . Sexual Activity: Not on file   Other Topics Concern  . Not on file   Social History Narrative   Patient lives at home with parents.    Patient is single.   Patient is right handed.    Patient has a high school education.    Patient is currently working part time at [employer].      PHYSICAL EXAM  Filed Vitals:   11/02/15 0850  BP: 148/94  Pulse: 68  Height: 5' 10.5" (1.791 m)  Weight: 442 lb 9.6 oz (200.762 kg)   Body  mass index is 62.59 kg/(m^2). Generalized: Well developed, morbidly obese male in no acute distress  Head: normocephalic and atraumatic,. Oropharynx benign  Neck: Supple, no carotid bruits  Cardiac: Regular rate rhythm, no murmur  Musculoskeletal: No deformity   Neurological examination   Mentation: Alert oriented to time, place, history taking. Attention span and concentration appropriate. Recent and remote memory intact. Follows all commands speech and language fluent.   Cranial nerve II-XII: Pupils were equal round reactive to light extraocular movements were full, visual field were full on confrontational test. Facial sensation and strength were normal. hearing was intact to finger rubbing bilaterally. Uvula tongue midline. head turning and shoulder shrug were normal and symmetric.Tongue protrusion into cheek strength was normal. Motor: normal bulk and tone, full strength in the BUE, BLE, fine finger movements normal, no pronator drift. No focal weakness Sensory: normal and symmetric to light touch, pinprick, and Vibration, proprioception  Coordination: finger-nose-finger, heel-to-shin bilaterally, no dysmetria Reflexes: Brachioradialis 2/2, biceps 2/2, triceps 2/2, patellar 2/2, Achilles 2/2, plantar responses were flexor bilaterally. Gait and Station: Rising up from seated position without assistance, normal stance, moderate stride, good arm swing, smooth turning, able to perform tiptoe, and heel walking without difficulty. Tandem gait is steady  DIAGNOSTIC DATA (LABS, IMAGING, TESTING)   ASSESSMENT AND PLAN  25 y.o. year old male  has a past medical history of Seizures (HCC) and Obesity. here to follow up. Last seizure April 2014.   PLAN:Will check labs today CBC CMP and dilantin level Continue Dilantin at current dose will refill Healthy diet and weight loss and exercise for overall health and well-being, patient has gained 40 pounds since last seen Follow-up yearly and  when necessary Nilda Riggs, [facility], [facility], APRN  [facility] [address], [address] [place], [place] [address] [phone]

## 2015-11-02 NOTE — Progress Notes (Signed)
I have read the note, and I agree with the clinical assessment and plan.  Khya Halls KEITH   

## 2015-11-02 NOTE — Patient Instructions (Signed)
Will check labs today  Continue Dilantin at current dose Healthy diet and weight loss and exercise for overall well-being Follow-up yearly and when necessary

## 2015-11-03 ENCOUNTER — Telehealth: Payer: Self-pay | Admitting: Nurse Practitioner

## 2015-11-03 DIAGNOSIS — G40309 Generalized idiopathic epilepsy and epileptic syndromes, not intractable, without status epilepticus: Secondary | ICD-10-CM

## 2015-11-03 DIAGNOSIS — Z5181 Encounter for therapeutic drug level monitoring: Secondary | ICD-10-CM

## 2015-11-03 LAB — COMPREHENSIVE METABOLIC PANEL
ALT: 34 IU/L (ref 0–44)
AST: 23 IU/L (ref 0–40)
Albumin/Globulin Ratio: 1.7 (ref 1.1–2.5)
Albumin: 4.4 g/dL (ref 3.5–5.5)
Alkaline Phosphatase: 54 IU/L (ref 39–117)
BUN/Creatinine Ratio: 10 (ref 8–19)
BUN: 8 mg/dL (ref 6–20)
Bilirubin Total: 0.3 mg/dL (ref 0.0–1.2)
CO2: 27 mmol/L (ref 18–29)
CREATININE: 0.81 mg/dL (ref 0.76–1.27)
Calcium: 9.5 mg/dL (ref 8.7–10.2)
Chloride: 99 mmol/L (ref 96–106)
GFR calc Af Amer: 143 mL/min/{1.73_m2} (ref >59)
GFR calc non Af Amer: 124 mL/min/{1.73_m2} (ref >59)
GLOBULIN, TOTAL: 2.6 g/dL (ref 1.5–4.5)
GLUCOSE: 95 mg/dL (ref 65–99)
Potassium: 4.3 mmol/L (ref 3.5–5.2)
SODIUM: 141 mmol/L (ref 134–144)
Total Protein: 7 g/dL (ref 6.0–8.5)

## 2015-11-03 LAB — CBC WITH DIFFERENTIAL/PLATELET
Basophils Absolute: 0 10*3/uL (ref 0.0–0.2)
Basos: 0 %
EOS (ABSOLUTE): 0.4 10*3/uL (ref 0.0–0.4)
Eos: 5 %
Hematocrit: 43.3 % (ref 37.5–51.0)
Hemoglobin: 14.3 g/dL (ref 12.6–17.7)
Immature Grans (Abs): 0 10*3/uL (ref 0.0–0.1)
Immature Granulocytes: 0 %
Lymphocytes Absolute: 2.2 10*3/uL (ref 0.7–3.1)
Lymphs: 26 %
MCH: 29.8 pg (ref 26.6–33.0)
MCHC: 33 g/dL (ref 31.5–35.7)
MCV: 90 fL (ref 79–97)
MONOCYTES: 10 %
MONOS ABS: 0.9 10*3/uL (ref 0.1–0.9)
Neutrophils Absolute: 5 10*3/uL (ref 1.4–7.0)
Neutrophils: 59 %
Platelets: 222 10*3/uL (ref 150–379)
RBC: 4.8 x10E6/uL (ref 4.14–5.80)
RDW: 13.9 % (ref 12.3–15.4)
WBC: 8.5 10*3/uL (ref 3.4–10.8)

## 2015-11-03 LAB — PHENYTOIN LEVEL, TOTAL: PHENYTOIN (DILANTIN), SERUM: 3.3 ug/mL — AB (ref 10.0–20.0)

## 2015-11-03 NOTE — Telephone Encounter (Signed)
Telephone call to patient. Made aware  Dilantin level 3.3. On questioning he is not taking his medication as prescribed. He is taking 2 capsules in the morning and 2 at night. He was asked to increase to 2 capsules in the morning and 3 at night and get repeat level in 10 days and is agreeable. The prescription that was called in  yesterday was for  2 in the morning and 3 at night

## 2016-05-08 ENCOUNTER — Encounter: Payer: 59 | Attending: Family Medicine | Admitting: Nutrition

## 2016-05-08 DIAGNOSIS — Z6841 Body Mass Index (BMI) 40.0 and over, adult: Secondary | ICD-10-CM | POA: Insufficient documentation

## 2016-05-08 DIAGNOSIS — Z713 Dietary counseling and surveillance: Secondary | ICD-10-CM | POA: Insufficient documentation

## 2016-05-08 DIAGNOSIS — I10 Essential (primary) hypertension: Secondary | ICD-10-CM

## 2016-05-08 NOTE — Patient Instructions (Signed)
Goals 1` Follow Plate Method 2. Exercise 60 minutes 4 times per week. 3. Cut down on fast foods to more than 1-2 per week. 4. Plan meals better and take more meals to work 5. Increase more fresh fruits or vegetables. 6. Lose 1-2 lbs per week.

## 2016-05-08 NOTE — Progress Notes (Signed)
  Medical Nutrition Therapy:  Appt start time: 1530 end time:  1630.   Assessment:  Primary concerns today:  Morbid Obesity. Lives with his parents. Works FT with [employer]. 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 Gained 170 lbs in the last 2 years. Played football in high school and now not physically active. Sedentary job.  Eats 2-3 meals per day. Eats a lot of fast foods. Recently blood started BP meds after having ear infections. Physical activity: likes to play golf and basketball, swims. Had first seizure 4 years ago. On Dilantin.  Only had 2 seizures 4 years ago.   Has cut out a lot of gatorade, sodas, sweet tea and working on eating low fat, lower sodium foods in the recent past.   Diet is excessive in calories, protein, fat and sodium. Willing to make lifestyle changes to lose weight and improve BP and reduce risk of developing DM and other health related problems.  Preferred Learning Style:   Auditory  Visual  Hands on  Learning Readiness:   Ready  Change in progress   MEDICATIONS: see list   DIETARY INTAKE:  24-hr recall:  B ( AM): Sausage, egg and cheese biscuit, Water Snk ( AM): none L ( PM): Fast food lunch, water; taco bell, subway, wendys, water Snk ( PM): none D ( PM): Chicken patties, mac/cheese, mashed potatoes, green beans, water Snk ( PM): none Beverages: water  Usual physical activity: 1 hr of golf or basketball or swimming  Estimated energy needs: 1800 calories 200 g carbohydrates 135 g protein 50 g fat  Progress Towards Goal(s):  In progress.   Nutritional Diagnosis:  [place]-3.3 Overweight/obesity As related to Excessive calorie intake.  As evidenced by BMI >50.    Intervention: Low Sodium, Low Fat High Fiber Diet, My Plate, portion sizes, meal planning, CHO counting, emotional eating, timing of meals,  risk for DM and other chronic health problems, benefits of exercise and need for weight loss.  Goals 1`  Follow Plate Method 2. Exercise 60 minutes 4 times per week. 3. Cut down on fast foods to more than 1-2 per week. 4. Plan meals better and take more meals to work 5. Increase more fresh fruits or vegetables. 6. Lose 1-2 lbs per week.  Teaching Method Utilized:  Visual Auditory Hands on  Handouts given during visit include:  The Plate Method   Meal Plan Card        Low Sodium Low Fat High Fiber Diet information  Barriers to learning/adherence to lifestyle change: none  Demonstrated degree of understanding via:  Teach Back   Monitoring/Evaluation:  Dietary intake, exercise, meal plannign, and body weight in 1 month(s).

## 2016-05-17 ENCOUNTER — Encounter: Payer: Self-pay | Admitting: Nutrition

## 2016-06-12 ENCOUNTER — Ambulatory Visit: Payer: 59 | Admitting: Nutrition

## 2016-06-19 ENCOUNTER — Ambulatory Visit: Payer: 59 | Admitting: Nutrition

## 2016-10-16 ENCOUNTER — Encounter: Payer: Self-pay | Admitting: Nurse Practitioner

## 2016-11-01 ENCOUNTER — Ambulatory Visit: Payer: PRIVATE HEALTH INSURANCE | Admitting: Nurse Practitioner

## 2017-04-08 ENCOUNTER — Emergency Department (HOSPITAL_COMMUNITY)
Admission: EM | Admit: 2017-04-08 | Discharge: 2017-04-08 | Disposition: A | Discharge status: Home / Self Care | Payer: 59 | Attending: Emergency Medicine | Admitting: Emergency Medicine

## 2017-04-08 ENCOUNTER — Emergency Department (HOSPITAL_COMMUNITY): Payer: 59

## 2017-04-08 ENCOUNTER — Encounter (HOSPITAL_COMMUNITY): Payer: Self-pay

## 2017-04-08 DIAGNOSIS — Z79899 Other long term (current) drug therapy: Secondary | ICD-10-CM | POA: Diagnosis not present

## 2017-04-08 DIAGNOSIS — I10 Essential (primary) hypertension: Secondary | ICD-10-CM | POA: Diagnosis not present

## 2017-04-08 DIAGNOSIS — J189 Pneumonia, unspecified organism: Secondary | ICD-10-CM | POA: Diagnosis not present

## 2017-04-08 DIAGNOSIS — R079 Chest pain, unspecified: Secondary | ICD-10-CM | POA: Diagnosis present

## 2017-04-08 HISTORY — DX: Essential (primary) hypertension: I10

## 2017-04-08 LAB — CBC WITH DIFFERENTIAL/PLATELET
Basophils Absolute: 0 10*3/uL (ref 0.0–0.1)
Basophils Relative: 0 %
Eosinophils Absolute: 0 10*3/uL (ref 0.0–0.7)
Eosinophils Relative: 0 %
HEMATOCRIT: 40.9 % (ref 39.0–52.0)
HEMOGLOBIN: 13.3 g/dL (ref 13.0–17.0)
LYMPHS ABS: 1.4 10*3/uL (ref 0.7–4.0)
Lymphocytes Relative: 14 %
MCH: 29.8 pg (ref 26.0–34.0)
MCHC: 32.5 g/dL (ref 30.0–36.0)
MCV: 91.7 fL (ref 78.0–100.0)
MONOS PCT: 12 %
Monocytes Absolute: 1.2 10*3/uL — ABNORMAL HIGH (ref 0.1–1.0)
NEUTROS ABS: 7.6 10*3/uL (ref 1.7–7.7)
NEUTROS PCT: 74 %
Platelets: 155 10*3/uL (ref 150–400)
RBC: 4.46 MIL/uL (ref 4.22–5.81)
RDW: 14.2 % (ref 11.5–15.5)
WBC: 10.3 10*3/uL (ref 4.0–10.5)

## 2017-04-08 LAB — BASIC METABOLIC PANEL
Anion gap: 9 (ref 5–15)
BUN: 5 mg/dL — AB (ref 6–20)
CHLORIDE: 99 mmol/L — AB (ref 101–111)
CO2: 27 mmol/L (ref 22–32)
CREATININE: 0.94 mg/dL (ref 0.61–1.24)
Calcium: 8.6 mg/dL — ABNORMAL LOW (ref 8.9–10.3)
GFR calc non Af Amer: >60 mL/min (ref >60)
Glucose, Bld: 105 mg/dL — ABNORMAL HIGH (ref 65–99)
POTASSIUM: 3.6 mmol/L (ref 3.5–5.1)
Sodium: 135 mmol/L (ref 135–145)

## 2017-04-08 LAB — D-DIMER, QUANTITATIVE: D-Dimer, Quant: 1.87 ug/mL-FEU — ABNORMAL HIGH (ref 0.00–0.50)

## 2017-04-08 MED ORDER — IOPAMIDOL (ISOVUE-370) INJECTION 76%
120.0000 mL | Freq: Once | INTRAVENOUS | Status: AC | PRN
  Administered 2017-04-08: 120 mL via INTRAVENOUS

## 2017-04-08 MED ORDER — AZITHROMYCIN 250 MG PO TABS: 250.0000 mg | ORAL_TABLET | Freq: Every day | ORAL | 0 refills | Status: AC

## 2017-04-08 NOTE — Discharge Instructions (Signed)
The testing indicates that you have a pneumonia.  There is no indication that you have a blood clot in the lungs.  Make sure that you are getting plenty of rest, and drinking a lot of fluids.  For pain and fever, use either acetaminophen or ibuprofen.   You  can use a cough medicine like Robitussin-DM to help with cough.  Return here, if needed for problems.

## 2017-04-08 NOTE — ED Triage Notes (Signed)
Reports of diarrhea that started Sunday. Now having fever, pain in neck and upper back. States it feels like its hard to take a deep breath.

## 2017-04-08 NOTE — ED Provider Notes (Signed)
[facility]-EMER[facility]Y DEPT Provider Note   CSN: [id number] Arrival date & time: 04/08/17  1247     History   Chief Complaint Chief Complaint  Patient presents with  . Fever  . Fatigue    HPI Matthew Andrade is a 26 y.o. male.  He is here for evaluation of pleuritic chest pain, present for several days, with general achiness and low-grade fever.  He denies cough, shaking chills, nausea, vomiting, weakness or dizziness.  He is having shortness of breath with exertion.  He has pain in the lateral neck, bilaterally, with movement of the head and neck.  He denies paresthesias.  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.  There are no other known modifying factors.  HPI  Past Medical History:  Diagnosis Date  . Hypertension   . Obesity   . Seizures [facility](HCC)     Patient Active Problem List   Diagnosis Date Noted  . 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 No pertinent surgical history.     Home Medications    Prior to Admission medications   Medication Sig Start Date End Date Taking? Authorizing Provider  ibuprofen (ADVIL,MOTRIN) 200 MG tablet Take 400 mg by mouth every 6 (six) hours as needed for fever.   Yes [provider]  phenytoin (DILANTIN) 100 MG ER capsule 2 caps in the am, 3 caps in the pm Patient taking differently: Take 200-300 mg by mouth 2 (two) times daily. 2 caps in the am, 3 caps in the pm 11/02/15  Yes Nilda RiggsMartin, Nancy Carolyn, NP  azithromycin (ZITHROMAX) 250 MG tablet Take 1 tablet (250 mg total) by mouth daily. Take first 2 tablets together, then 1 every day until finished. 04/08/17   Mancel BaleWentz, Rumi Kolodziej, MD    Family History Family History  Problem Relation Age of Onset  . Hypertension Mother   . Hypertension Father   . Diabetes Father   . Heart Problems Maternal Grandmother   . Diabetes Maternal Grandfather   . Heart Problems Maternal Grandfather     Social History Social History  Substance Use Topics  . Smoking status:  Never Smoker  . Smokeless tobacco: Never Used  . Alcohol use Yes     Comment: Once monthly     Allergies   Sulfa antibiotics   Review of Systems Review of Systems  All other systems reviewed and are negative.    Physical Exam Updated Vital Signs BP (!) 164/92   Pulse 90   Temp 99.7 F (37.6 C) (Oral)   Resp 20   Ht 5\' 11"  (1.803 m)   Wt (!) 204.1 kg (450 lb)   SpO2 94%   BMI 62.76 kg/m   Physical Exam  Constitutional: He is oriented to person, place, and time. He appears well-developed.  Morbidly obese  HENT:  Head: Normocephalic and atraumatic.  Right Ear: External ear normal.  Left Ear: External ear normal.  Eyes: Pupils are equal, round, and reactive to light. Conjunctivae and EOM are normal.  Neck: Normal range of motion and phonation normal. Neck supple.  Cardiovascular: Normal rate, regular rhythm and normal heart sounds.   Pulmonary/Chest: Effort normal and breath sounds normal. No respiratory distress. He exhibits no tenderness and no bony tenderness.  Abdominal: Soft. There is no tenderness.  Musculoskeletal: Normal range of motion.  Neurological: He is alert and oriented to person, place, and time. No cranial nerve deficit or sensory deficit. He exhibits normal muscle tone. Coordination normal.  Skin: Skin is warm,  dry and intact.  Psychiatric: He has a normal mood and affect. His behavior is normal. Judgment and thought content normal.  Nursing note and vitals reviewed.    ED Treatments / Results  Labs (all labs ordered are listed, but only abnormal results are displayed) Labs Reviewed  CBC WITH DIFFERENTIAL/PLATELET - Abnormal; Notable for the following:       Result Value   Monocytes Absolute 1.2 (*)    All other components within normal limits  BASIC METABOLIC PANEL - Abnormal; Notable for the following:    Chloride 99 (*)    Glucose, Bld 105 (*)    BUN 5 (*)    Calcium 8.6 (*)    All other components within normal limits  D-DIMER,  QUANTITATIVE (NOT AT [facility]) - Abnormal; Notable for the following:    D-Dimer, Quant 1.87 (*)    All other components within normal limits    EKG  EKG Interpretation None       Radiology Dg Chest 2 View  Result Date: 04/08/2017 CLINICAL DATA:  Cough.  Neck pain EXAM: CHEST  2 VIEW COMPARISON:  None available FINDINGS: Normal heart size and mediastinal contours. No acute infiltrate or edema. No effusion or pneumothorax. No acute osseous findings. IMPRESSION: Negative chest. Electronically Signed   By: Marnee Spring M.D.   On: 04/08/2017 13:15   Ct Angio Chest Pe W/cm &/or Wo Cm  Result Date: 04/08/2017 CLINICAL DATA:  Pleuritic chest pain EXAM: CT ANGIOGRAPHY CHEST WITH CONTRAST TECHNIQUE: Multidetector CT imaging of the chest was performed using the standard protocol during bolus administration of intravenous contrast. Multiplanar CT image reconstructions and MIPs were obtained to evaluate the vascular anatomy. CONTRAST:  120 cc Isovue 3 7 COMPARISON:  None. FINDINGS: Cardiovascular: Satisfactory opacification of the pulmonary arteries to the segmental level. No evidence of pulmonary embolism. Normal heart size. No pericardial effusion. Mediastinum/Nodes: No enlarged mediastinal, hilar, or axillary lymph nodes. Thyroid gland, trachea, and esophagus demonstrate no significant findings. Lungs/Pleura: Lungs are under aerated with dependent atelectasis. There is minimal patchy bilateral lower lobe opacities on image 40 of series 7. Upper Abdomen: No acute abnormality. Musculoskeletal: There is no vertebral compression deformity. Bilateral gynecomastia. Review of the MIP images confirms the above findings. IMPRESSION: No evidence of acute pulmonary thromboembolism. Patchy bilateral lower lobe opacities worrisome for an inflammatory process. Electronically Signed   By: Jolaine Click M.D.   On: 04/08/2017 17:08    Procedures Procedures (including critical care time)  Medications Ordered in  ED Medications  iopamidol (ISOVUE-370) 76 % injection 120 mL (120 mLs Intravenous Contrast Given 04/08/17 1649)     Initial Impression / Assessment and Plan / ED Course  I have reviewed the triage vital signs and the nursing notes.  Pertinent labs & imaging results that were available during my care of the patient were reviewed by me and considered in my medical decision making (see chart for details).  Clinical Course as of Apr 08 1734  Tue Apr 08, 2017  1637 D-dimer is elevated, CT angiogram ordered to evaluate for pulmonary embolus.  [EW]    Clinical Course User Index [EW] Mancel Bale, MD     Patient Vitals for the past 24 hrs:  BP Temp Temp src Pulse Resp SpO2 Height Weight  04/08/17 1630 (!) 164/92 - - 90 20 94 % - -  04/08/17 1426 (!) 166/94 99.7 F (37.6 C) Oral 70 20 95 % - -  04/08/17 1252 (!) 159/87 100.1 F (37.8 C)  Oral 96 (!) 22 96 % 5\' 11"  (1.803 m) (!) 204.1 kg (450 lb)    5:34 PM Reevaluation with update and discussion. After initial assessment and treatment, an updated evaluation reveals no change in clinical status.  Findings discussed with patient, and family members, all questions were answered. Karishma Unrein L     Final Clinical Impressions(s) / ED Diagnoses   Final diagnoses:  Atypical pneumonia   Evaluation consistent with pneumonia causing pleuritic chest pain right lower lobe.  Doubt sepsis, metabolic instability or impending vascular collapse.  Nursing Notes Reviewed/ Care Coordinated Applicable Imaging Reviewed Interpretation of Laboratory Data incorporated into ED treatment  The patient appears reasonably screened and/or stabilized for discharge and I doubt any other medical condition or other [facility] requiring further screening, evaluation, or treatment in the ED at this time prior to discharge.  Plan: Home Medications-OTC antipyretic and analgesia; Home Treatments-rest, fluids; return here if the recommended treatment, does not improve the  symptoms; Recommended follow up-PCP, as needed   New Prescriptions New Prescriptions   AZITHROMYCIN (ZITHROMAX) 250 MG TABLET    Take 1 tablet (250 mg total) by mouth daily. Take first 2 tablets together, then 1 every day until finished.     Mancel Bale, MD 04/08/17 [phone]

## 2018-05-12 IMAGING — CT CT ANGIO CHEST
2 of 6 series · 18 of 46 positions shown · IV contrast (Isovue)
Comparison: None.

CLINICAL DATA: Pleuritic chest pain

EXAM:
CT ANGIOGRAPHY CHEST WITH CONTRAST
TECHNIQUE: Multidetector CT imaging of the chest was performed using the
standard protocol during bolus administration of intravenous
contrast. Multiplanar CT image reconstructions and MIPs were
obtained to evaluate the vascular anatomy.
CONTRAST:  120 cc Isovue 3 7

[Series 6: thins · axial · 0.87mm/px · z∈[+1132,+1413]mm · 15 of 309 slices shown]
[im 14/309  lung]
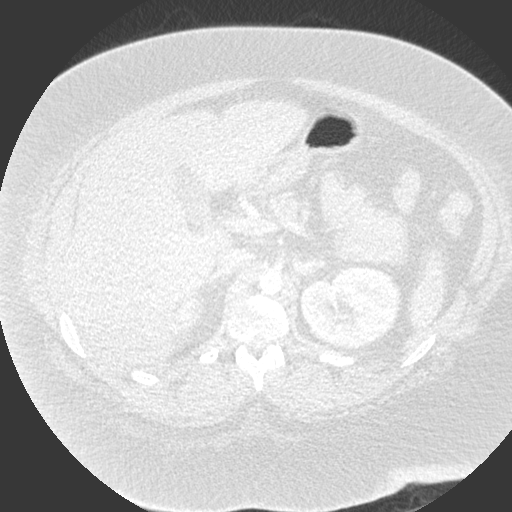
[im 41/309  soft-tissue]
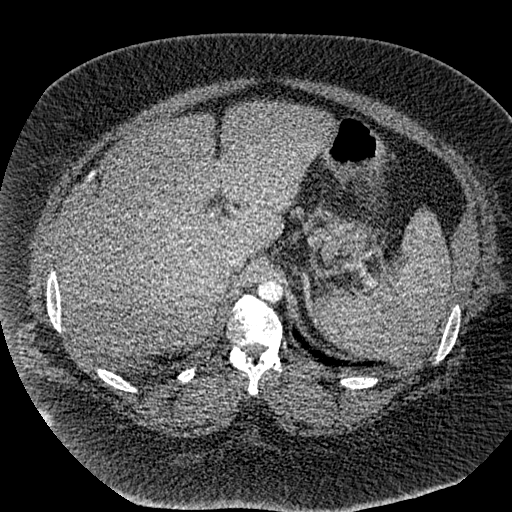
[im 54/309  lung]
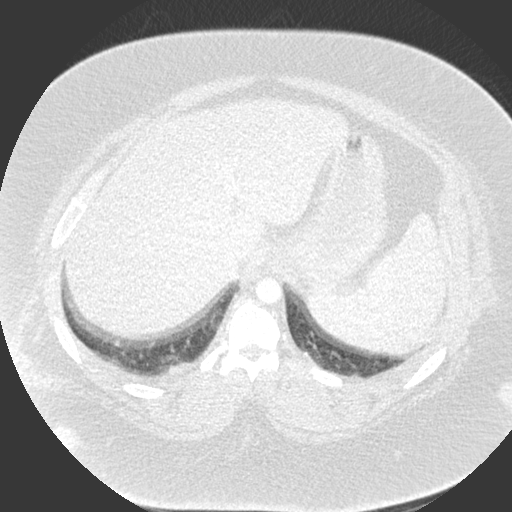
[im 81/309  soft-tissue]
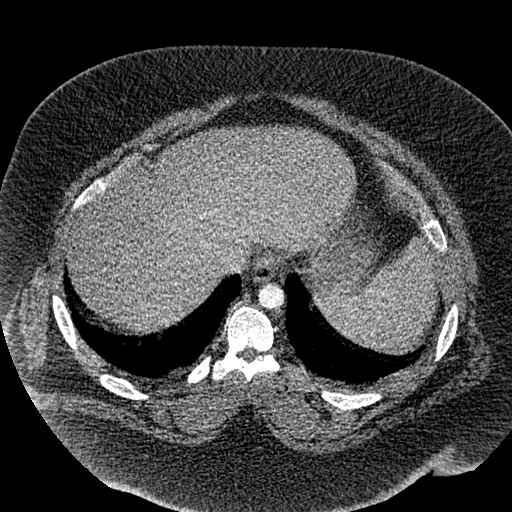
[im 94/309  lung]
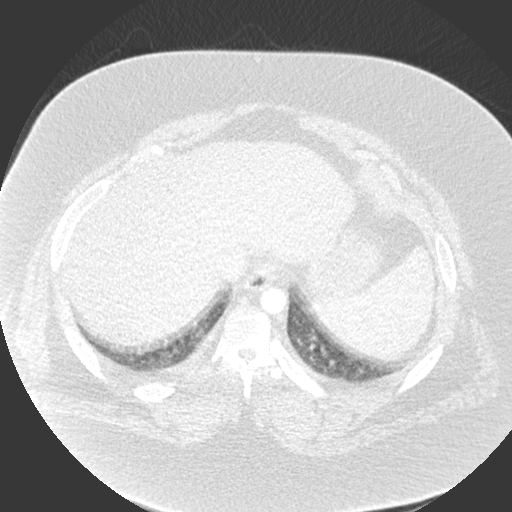
[im 121/309  soft-tissue]
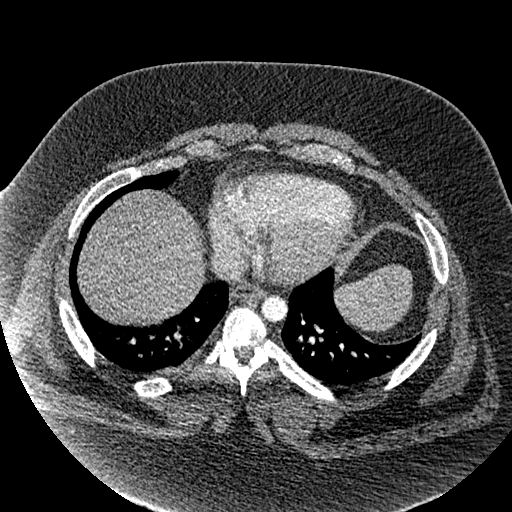
[im 134/309  lung]
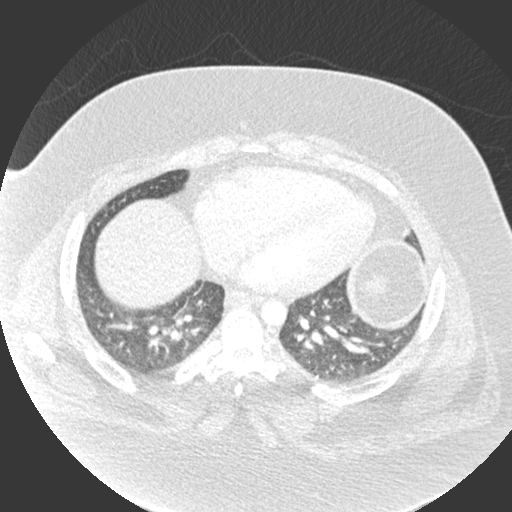
[im 161/309  soft-tissue]
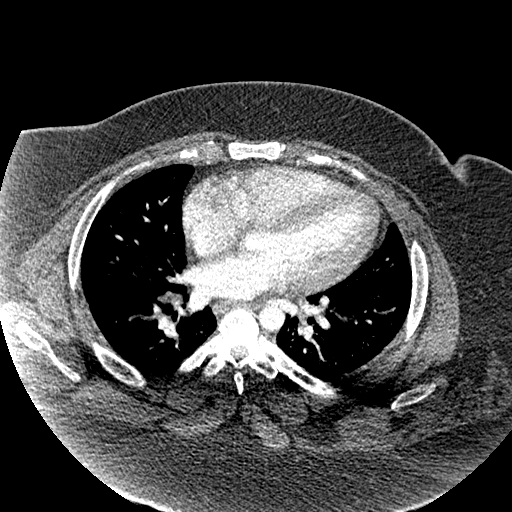
[im 175/309  lung]
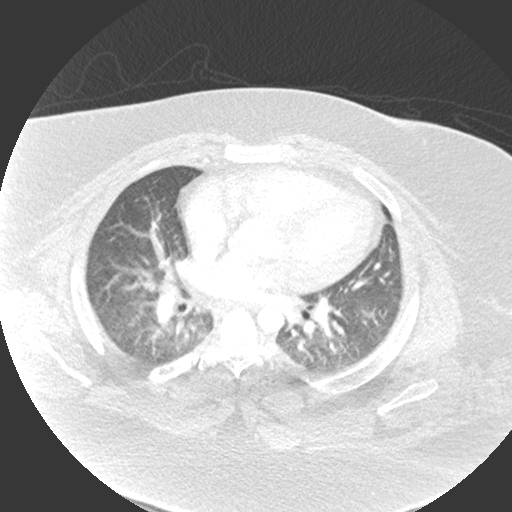
[im 188/309  soft-tissue]
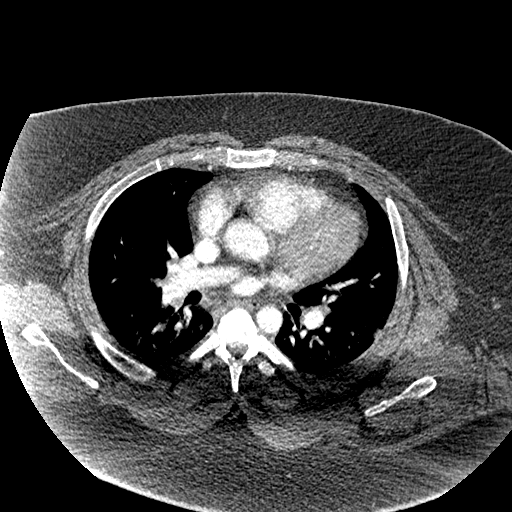
[im 215/309  lung]
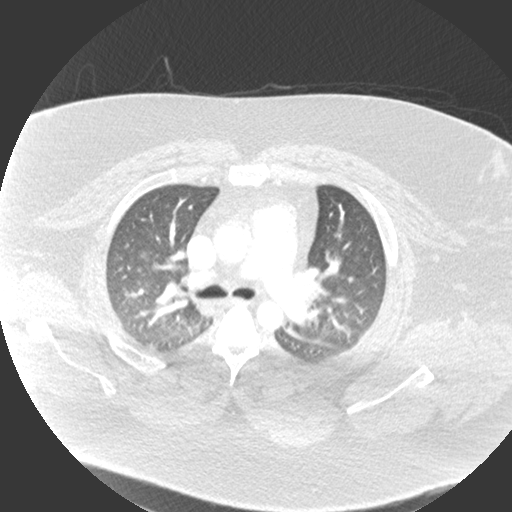
[im 228/309  soft-tissue]
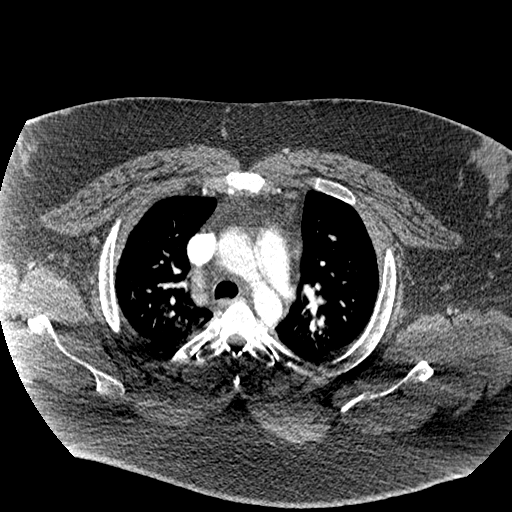
[im 255/309  lung]
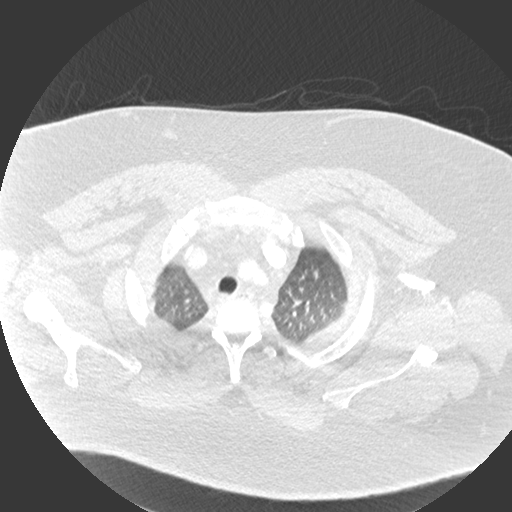
[im 268/309  soft-tissue]
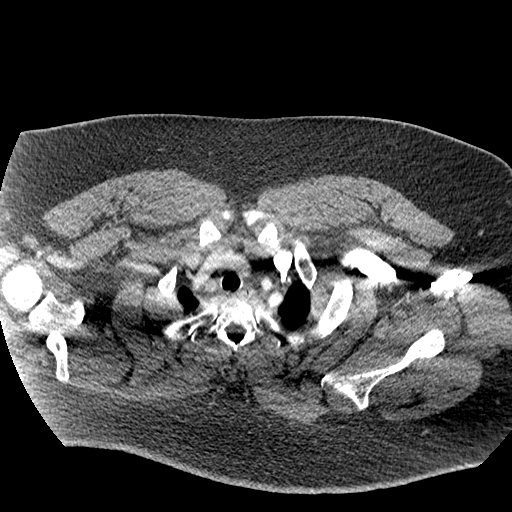
[im 295/309  lung]
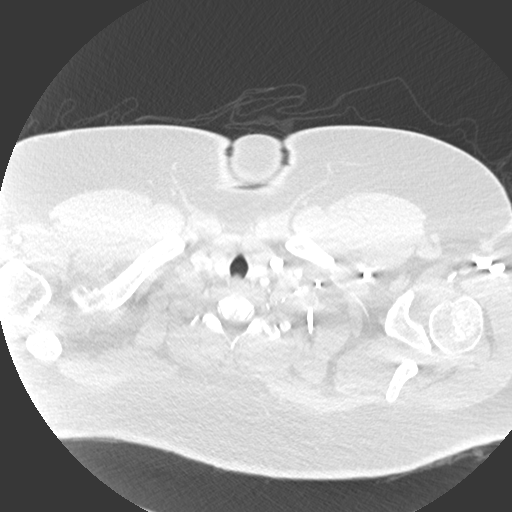

[Series 8: coronal mpr · coronal · 0.63mm/px · 3 of 166 slices shown]
[im 42/166  soft-tissue]
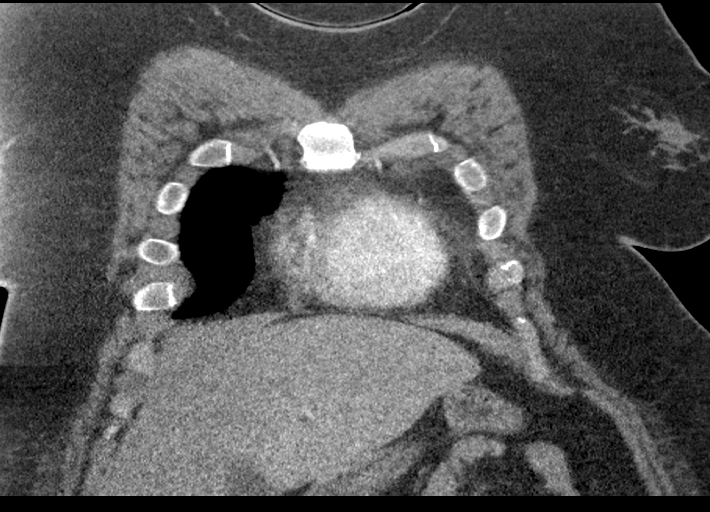
[im 83/166  soft-tissue]
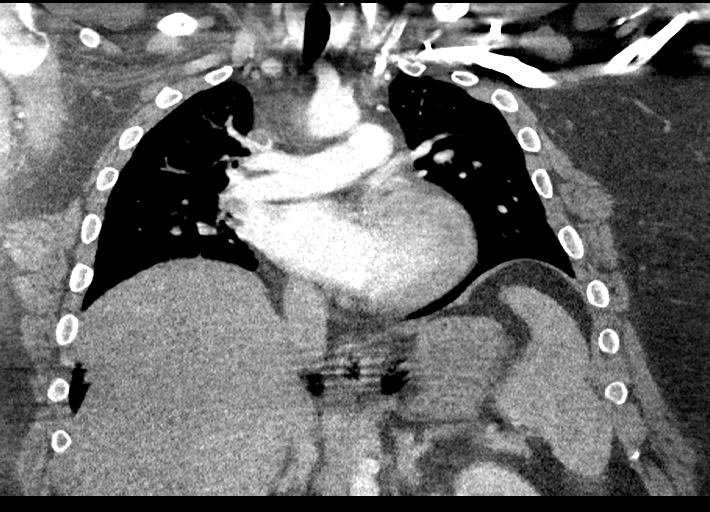
[im 124/166  soft-tissue]
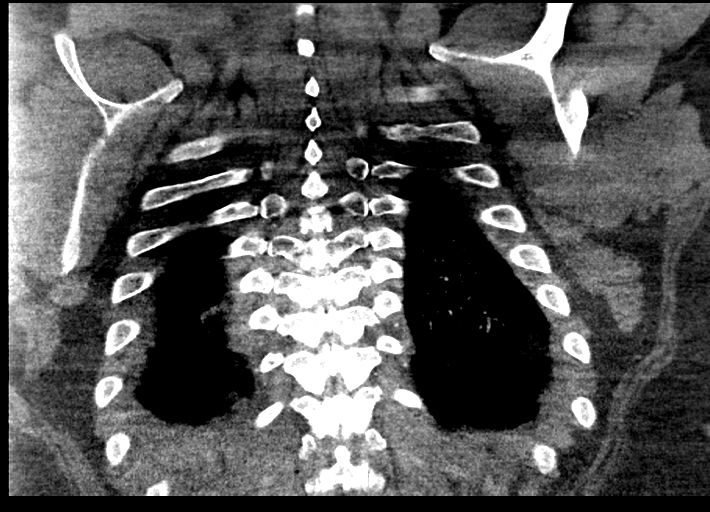

[18 of 46 positions shown; findings below may reference images not displayed]

FINDINGS: Cardiovascular: Satisfactory opacification of the pulmonary arteries
to the segmental level. No evidence of pulmonary embolism. Normal
heart size. No pericardial effusion.

Mediastinum/Nodes: No enlarged mediastinal, hilar, or axillary lymph
nodes. Thyroid gland, trachea, and esophagus demonstrate no
significant findings.

Lungs/Pleura: Lungs are under aerated with dependent atelectasis.
There is minimal patchy bilateral lower lobe opacities on image 40
of series 7.

Upper Abdomen: No acute abnormality.

Musculoskeletal: There is no vertebral compression deformity.
Bilateral gynecomastia.

Review of the MIP images confirms the above findings.
IMPRESSION: No evidence of acute pulmonary thromboembolism.

Patchy bilateral lower lobe opacities worrisome for an inflammatory
process.

## 2018-05-12 IMAGING — DX DG CHEST 2V
3 series · 3 of 3 positions shown · non-contrast
Comparison: None available

CLINICAL DATA: Cough.  Neck pain

EXAM:
CHEST  2 VIEW

[chest pa (1 of 2)]
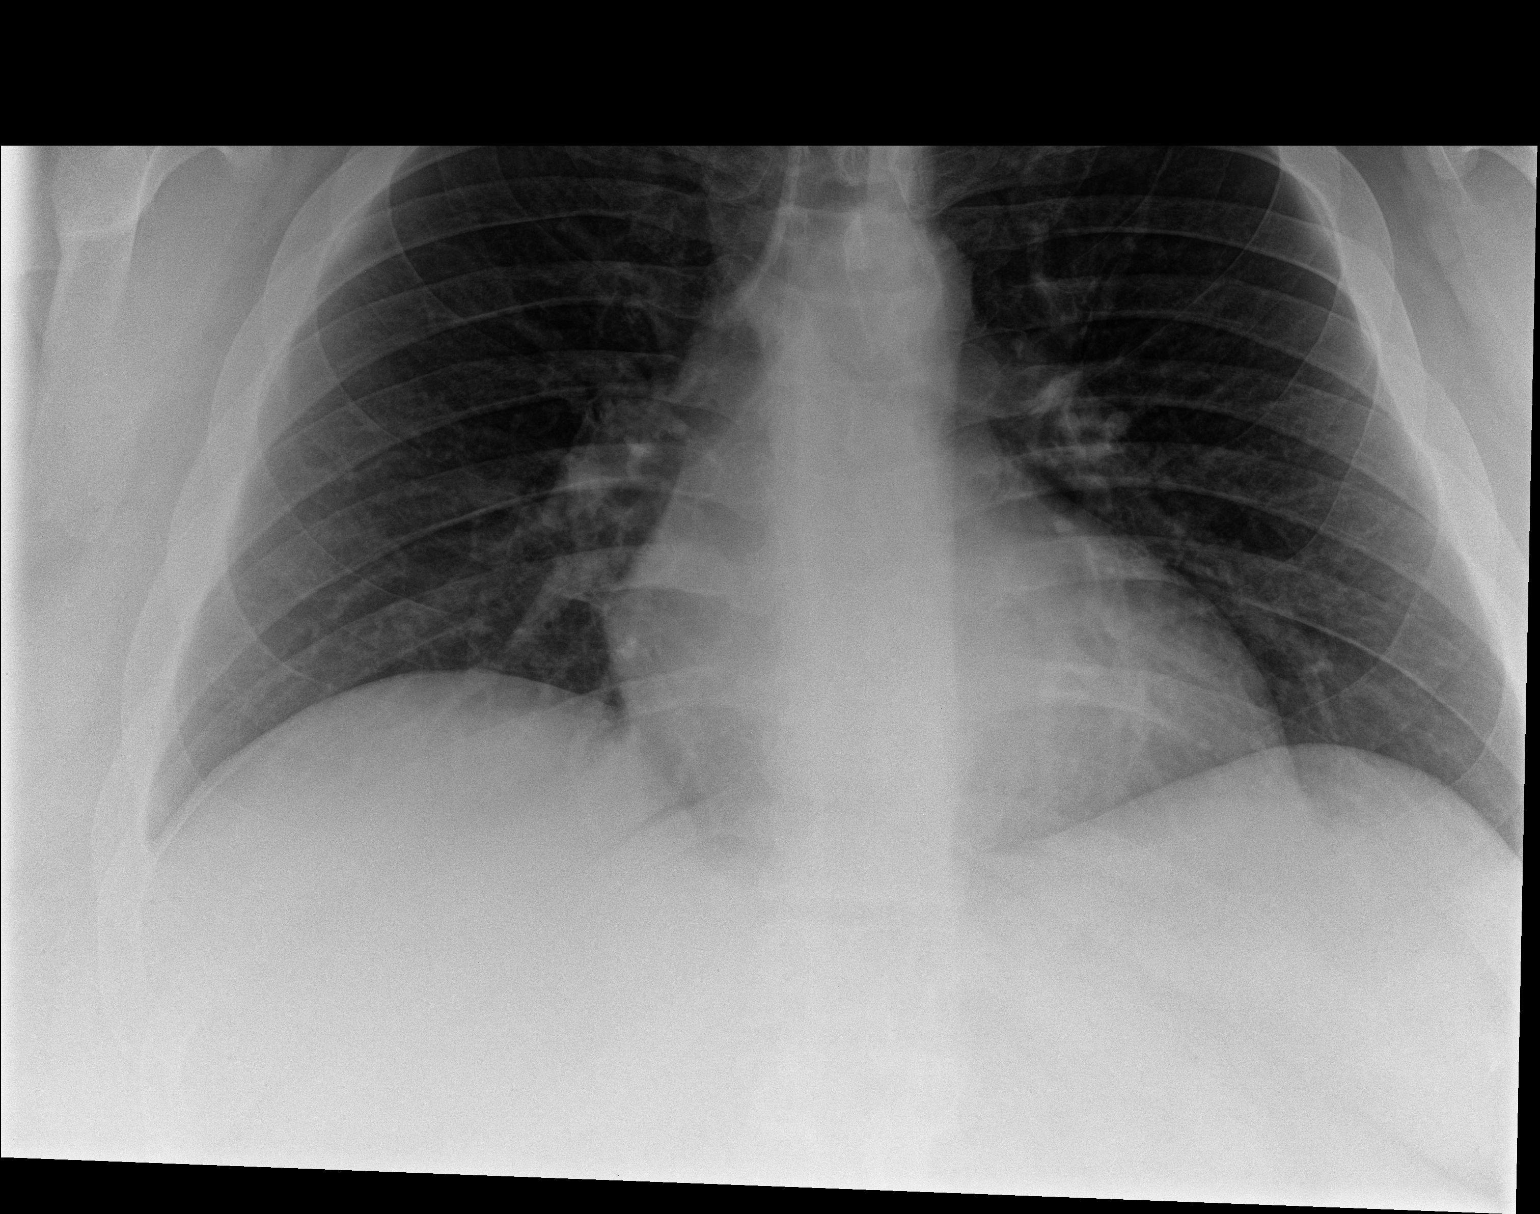

[chest lat]
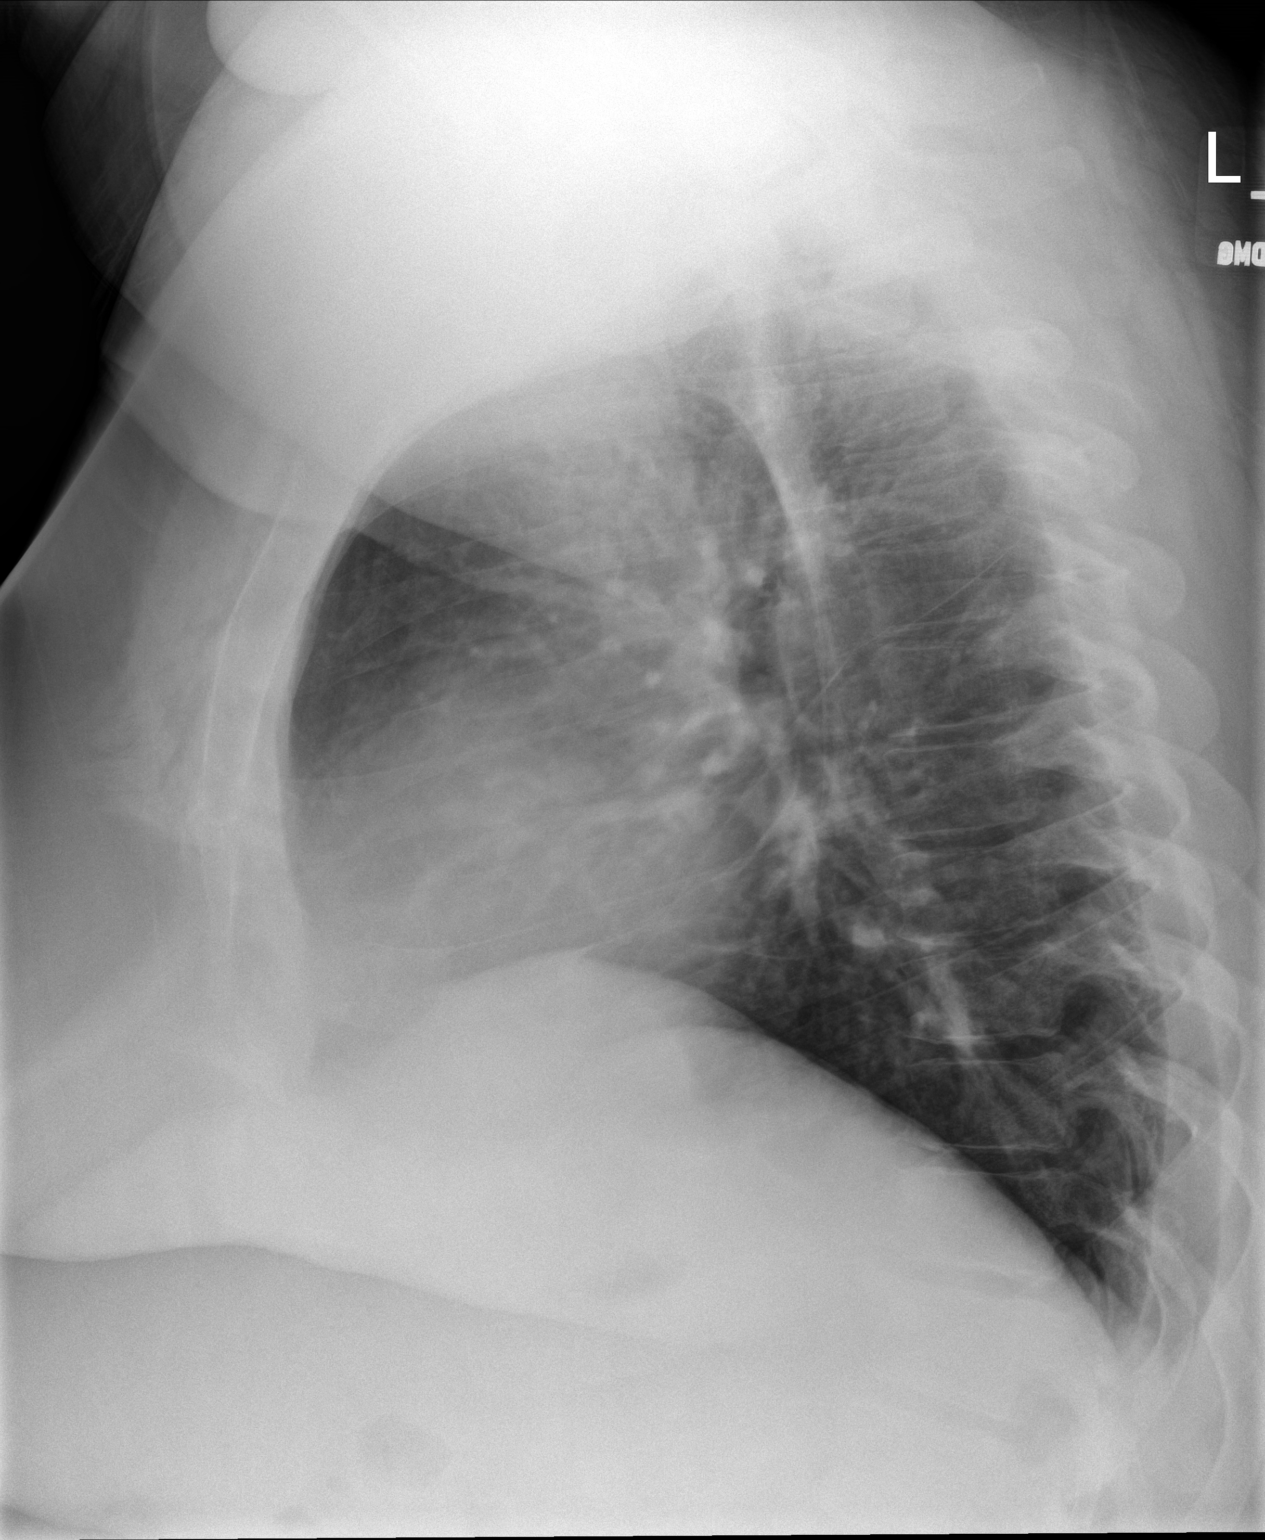

[chest pa (2 of 2)]
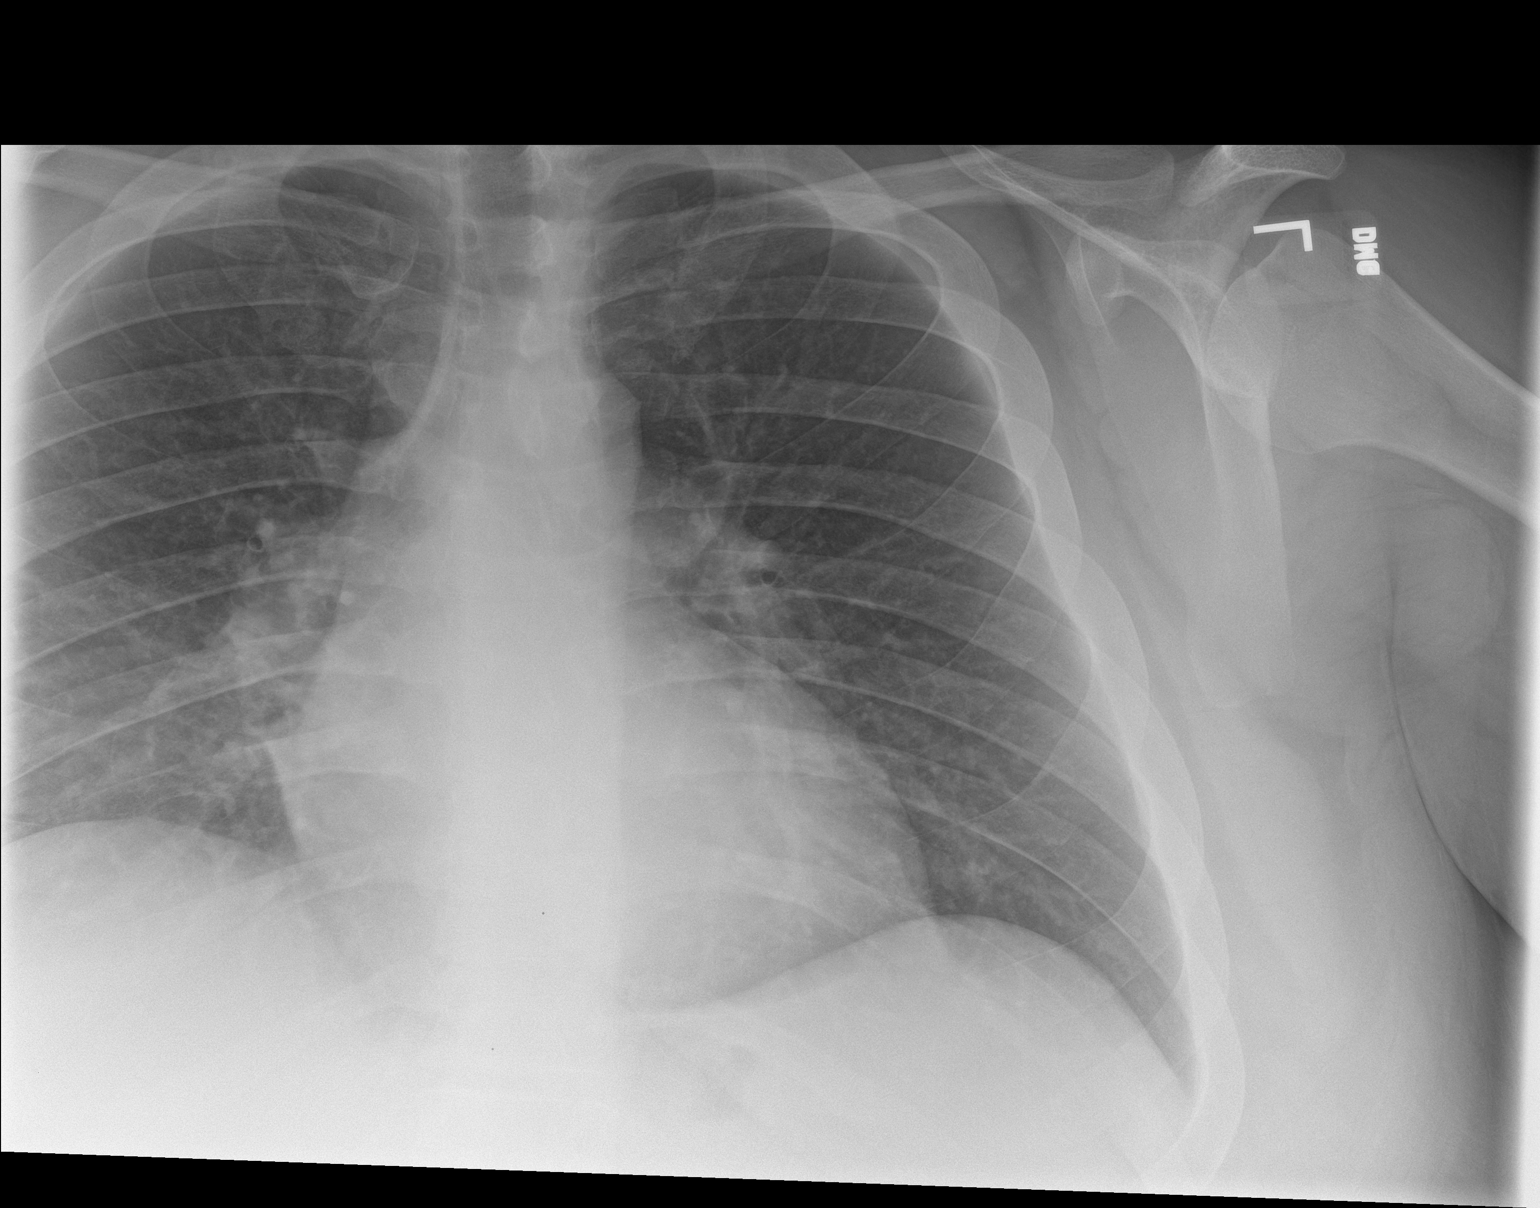

[3 of 3 positions shown; findings below may reference images not displayed]

FINDINGS: Normal heart size and mediastinal contours. No acute infiltrate or
edema. No effusion or pneumothorax. No acute osseous findings.
IMPRESSION: Negative chest.
